# Patient Record
Sex: Male | Born: 1985 | Race: White | Hispanic: No | Marital: Single | Smoking: Never smoker
Health system: Southern US, Community
[De-identification: ages and names within clinical notes are randomized; demographics above are authoritative.]

## PROBLEM LIST (undated history)

## (undated) DIAGNOSIS — F410 Panic disorder [episodic paroxysmal anxiety] without agoraphobia: Secondary | ICD-10-CM

## (undated) DIAGNOSIS — F419 Anxiety disorder, unspecified: Secondary | ICD-10-CM

## (undated) DIAGNOSIS — Z87898 Personal history of other specified conditions: Secondary | ICD-10-CM

## (undated) DIAGNOSIS — F988 Other specified behavioral and emotional disorders with onset usually occurring in childhood and adolescence: Secondary | ICD-10-CM

---

## 2007-12-14 HISTORY — PX: CHEST TUBE INSERTION: SHX231

## 2009-01-18 ENCOUNTER — Emergency Department (HOSPITAL_COMMUNITY): Admission: EM | Admit: 2009-01-18 | Discharge: 2009-01-19 | Payer: Self-pay | Admitting: Emergency Medicine

## 2009-01-20 ENCOUNTER — Inpatient Hospital Stay (HOSPITAL_COMMUNITY): Admission: EM | Admit: 2009-01-20 | Discharge: 2009-02-03 | Payer: Self-pay | Admitting: Emergency Medicine

## 2009-01-26 ENCOUNTER — Ambulatory Visit: Payer: Self-pay | Admitting: Thoracic Surgery

## 2009-01-27 ENCOUNTER — Encounter: Payer: Self-pay | Admitting: Thoracic Surgery

## 2009-01-30 ENCOUNTER — Ambulatory Visit: Payer: Self-pay | Admitting: Infectious Diseases

## 2009-01-30 ENCOUNTER — Encounter: Payer: Self-pay | Admitting: Thoracic Surgery

## 2009-02-12 ENCOUNTER — Ambulatory Visit: Payer: Self-pay | Admitting: Thoracic Surgery

## 2009-02-12 ENCOUNTER — Encounter: Admission: RE | Admit: 2009-02-12 | Discharge: 2009-02-12 | Payer: Self-pay | Admitting: Thoracic Surgery

## 2009-03-17 ENCOUNTER — Encounter: Admission: RE | Admit: 2009-03-17 | Discharge: 2009-03-17 | Payer: Self-pay | Admitting: Thoracic Surgery

## 2009-03-17 ENCOUNTER — Ambulatory Visit: Payer: Self-pay | Admitting: Thoracic Surgery

## 2010-08-30 IMAGING — CR DG CHEST 2V
3 series · 3 of 3 positions shown · non-contrast
Comparison: None available

CLINICAL DATA: Wheezing.  Chest pain.  Short of breath.  Nausea.
Vomiting.  Weakness.

CHEST - 2 VIEW

[w chest lat (1 of 2)]
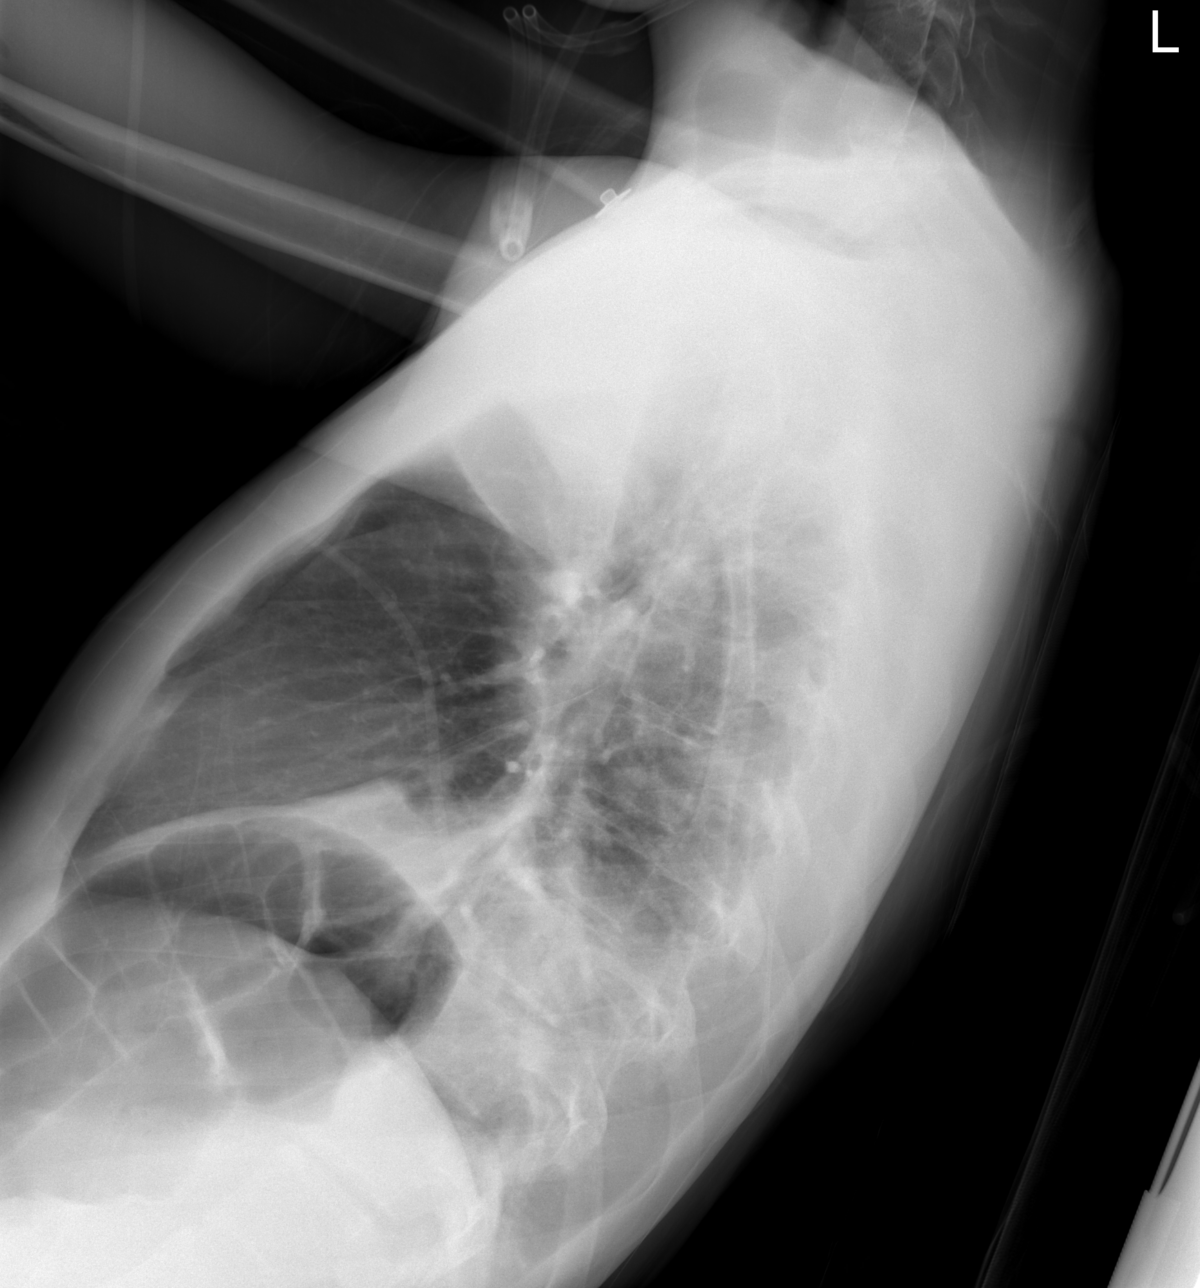

[w chest lat (2 of 2)]
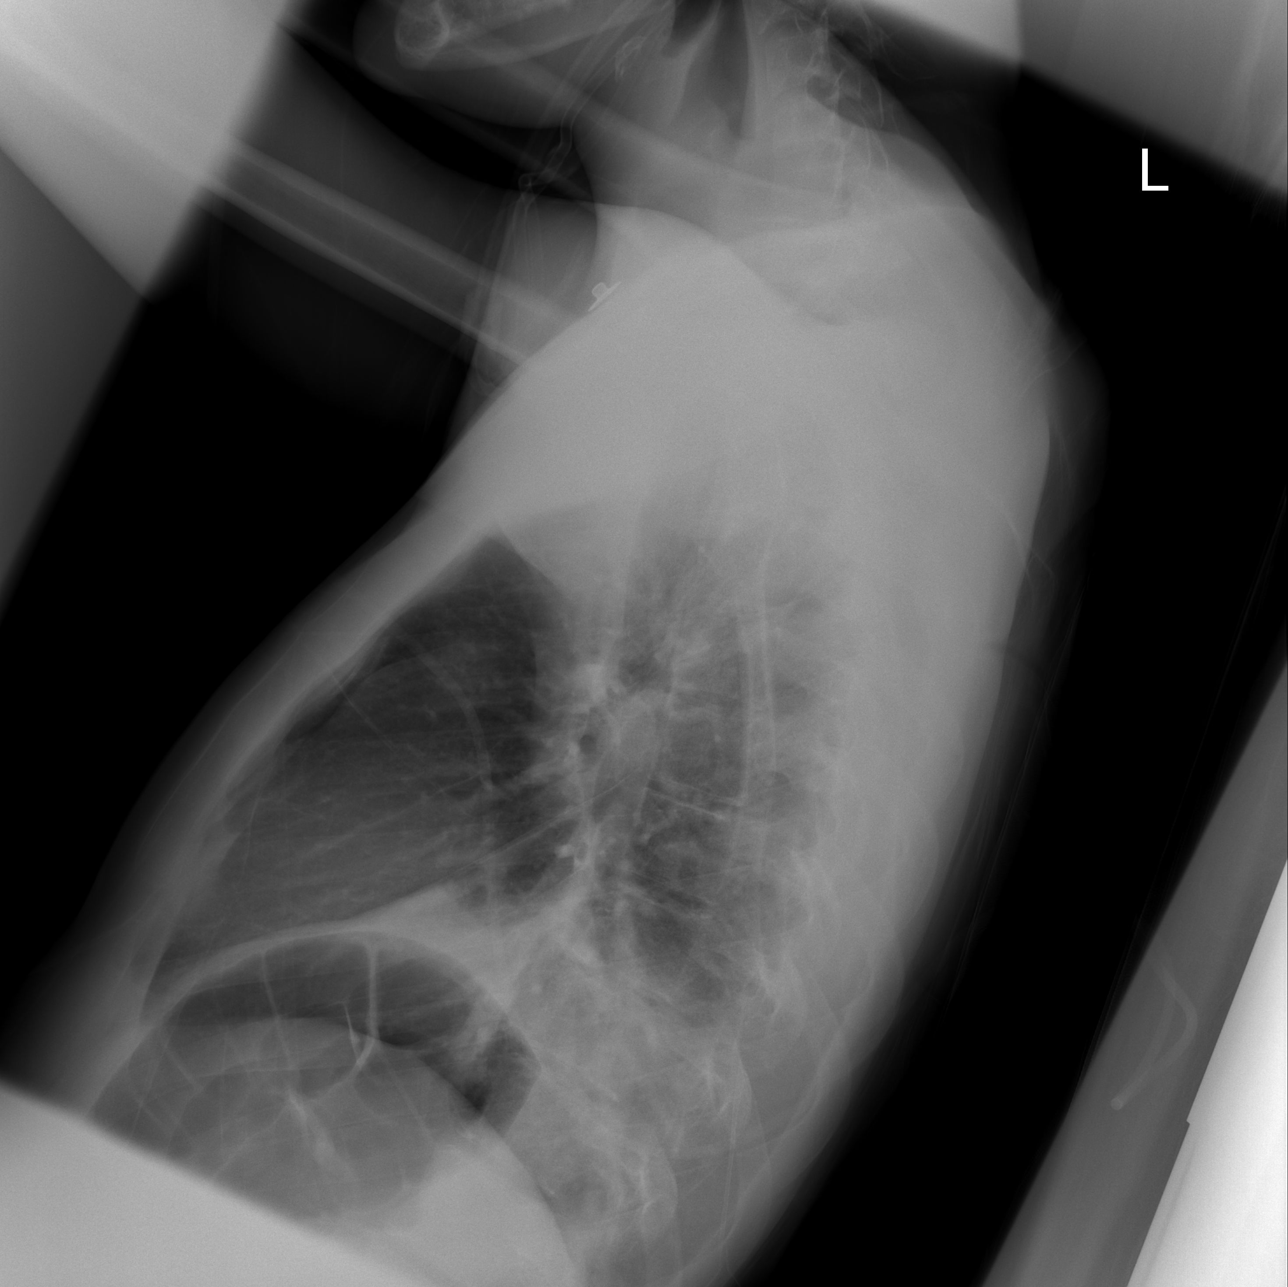

[view not recorded]
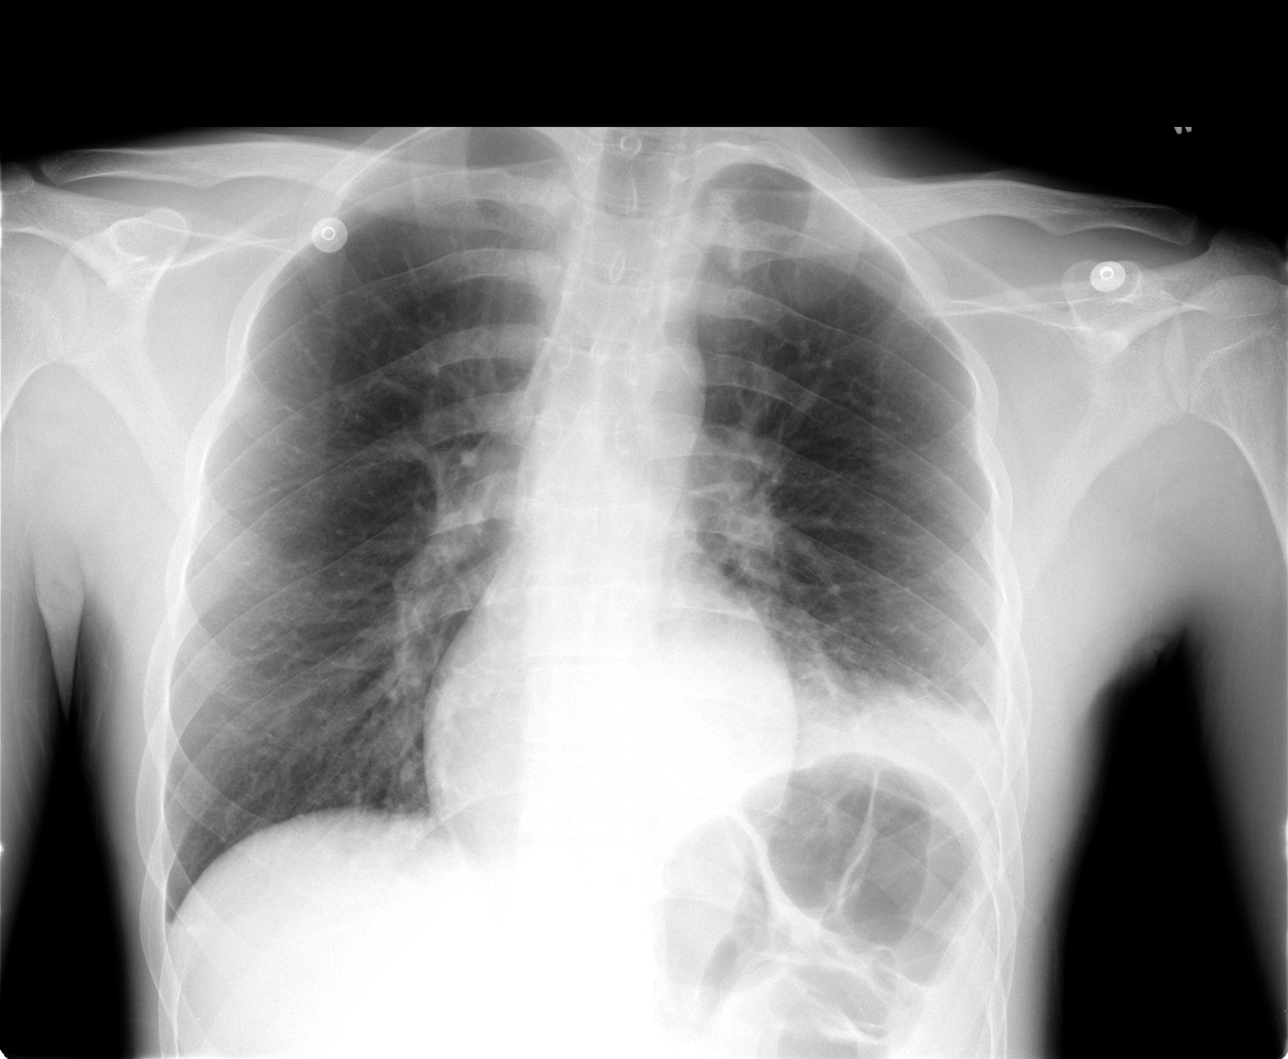

[3 of 3 positions shown; findings below may reference images not displayed]

FINDINGS: Volume loss and airspace disease are present within the
left lower lobe.  There is elevation of the left hemidiaphragm
associated with the volume loss.  The cardiopericardial silhouette
appears within normal limits.  The right lung is clear.  There may
be left pleural effusion. Follow-up to ensure radiographic clearing
recommended.  Clearing is usually observed at 4-6 weeks.
IMPRESSION: 1.  Left lower lobe volume loss and airspace disease.  Findings
most consistent with left lower lobe pneumonia and associated
atelectasis.

## 2010-08-31 IMAGING — CR DG CHEST 1V PORT
1 series · 1 of 1 positions shown · non-contrast
Comparison: 01/20/2009

CLINICAL DATA: Respiratory distress.

PORTABLE CHEST - 1 VIEW

[view not recorded]
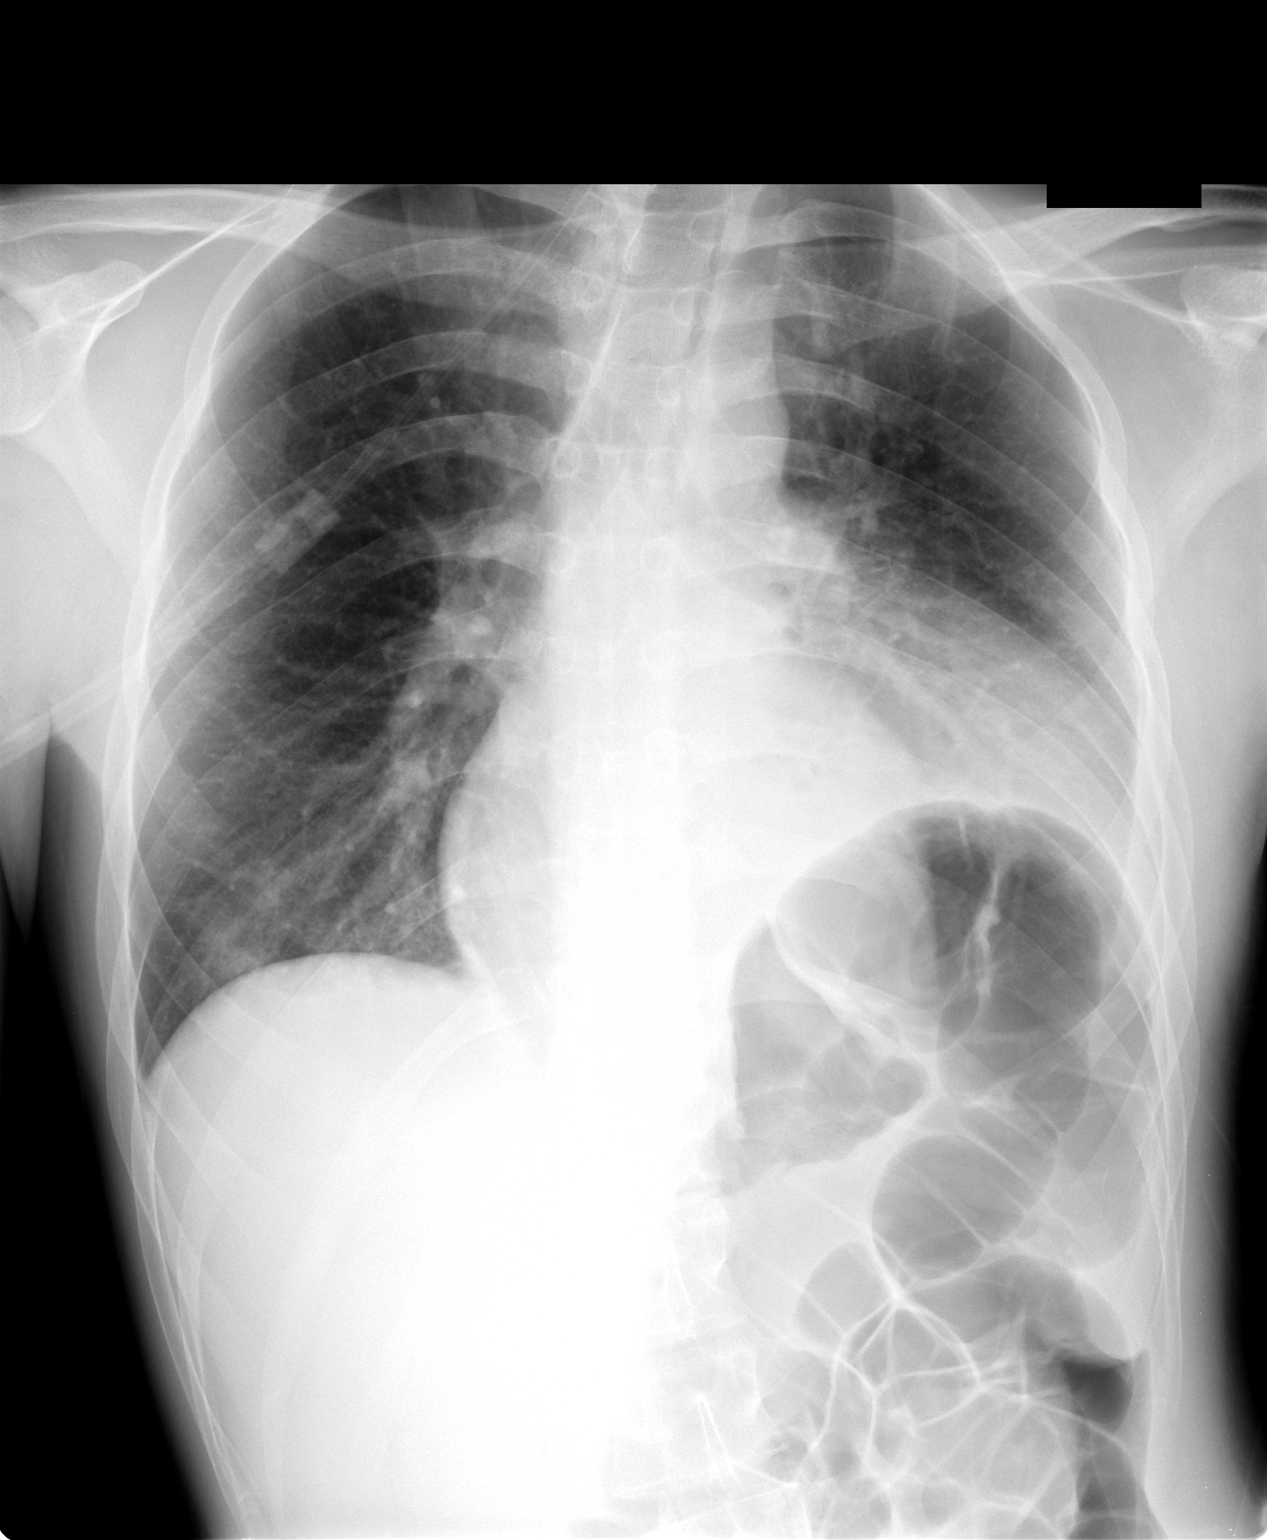

[1 of 1 positions shown; findings below may reference images not displayed]

FINDINGS: The cardiac silhouette, mediastinal and hilar contours
are stable.  There is persistent elevation of left hemidiaphragm
with slight progression of left lower lobe airspace process and
effusion.  The right lung is relatively clear except for patchy
basilar atelectasis or developing infiltrate.
IMPRESSION: 1.  Progressive left lower lobe process, likely pneumonia and
parapneumonic effusion.
2.  New patchy opacity at the right lung base may reflect
atelectasis or developing infiltrate.

## 2010-10-22 ENCOUNTER — Emergency Department (HOSPITAL_COMMUNITY): Admission: EM | Admit: 2010-10-22 | Discharge: 2010-10-22 | Payer: Self-pay | Admitting: Emergency Medicine

## 2010-12-10 ENCOUNTER — Emergency Department (HOSPITAL_COMMUNITY)
Admission: EM | Admit: 2010-12-10 | Discharge: 2010-12-10 | Payer: Self-pay | Source: Home / Self Care | Admitting: Emergency Medicine

## 2010-12-17 ENCOUNTER — Emergency Department (HOSPITAL_COMMUNITY)
Admission: EM | Admit: 2010-12-17 | Discharge: 2010-12-17 | Payer: Self-pay | Source: Home / Self Care | Admitting: Emergency Medicine

## 2010-12-17 LAB — URINALYSIS, ROUTINE W REFLEX MICROSCOPIC
Bilirubin Urine: NEGATIVE
Hemoglobin, Urine: NEGATIVE
Ketones, ur: NEGATIVE mg/dL
Nitrite: NEGATIVE
Protein, ur: NEGATIVE mg/dL
Specific Gravity, Urine: 1.01 (ref 1.005–1.030)
Urine Glucose, Fasting: NEGATIVE mg/dL
Urobilinogen, UA: 0.2 mg/dL (ref 0.0–1.0)
pH: 7 (ref 5.0–8.0)

## 2010-12-17 LAB — RAPID URINE DRUG SCREEN, HOSP PERFORMED
Amphetamines: NOT DETECTED
Barbiturates: NOT DETECTED
Benzodiazepines: NOT DETECTED
Cocaine: NOT DETECTED
Opiates: NOT DETECTED
Tetrahydrocannabinol: NOT DETECTED

## 2011-01-03 ENCOUNTER — Encounter: Payer: Self-pay | Admitting: Thoracic Surgery

## 2011-01-04 ENCOUNTER — Encounter: Payer: Self-pay | Admitting: Thoracic Surgery

## 2011-02-22 LAB — POCT I-STAT, CHEM 8
BUN: 15 mg/dL (ref 6–23)
Calcium, Ion: 1.11 mmol/L — ABNORMAL LOW (ref 1.12–1.32)
Chloride: 105 mEq/L (ref 96–112)
Creatinine, Ser: 1.2 mg/dL (ref 0.4–1.5)
Glucose, Bld: 83 mg/dL (ref 70–99)
HCT: 44 % (ref 39.0–52.0)
Hemoglobin: 15 g/dL (ref 13.0–17.0)
Potassium: 3.6 mEq/L (ref 3.5–5.1)
Sodium: 141 mEq/L (ref 135–145)
TCO2: 27 mmol/L (ref 0–100)

## 2011-02-22 LAB — RAPID URINE DRUG SCREEN, HOSP PERFORMED
Amphetamines: NOT DETECTED
Barbiturates: NOT DETECTED
Benzodiazepines: POSITIVE — AB
Cocaine: NOT DETECTED
Opiates: NOT DETECTED
Tetrahydrocannabinol: NOT DETECTED

## 2011-03-25 ENCOUNTER — Emergency Department (HOSPITAL_COMMUNITY)
Admission: EM | Admit: 2011-03-25 | Discharge: 2011-03-25 | Disposition: A | Discharge status: Home / Self Care | Payer: Self-pay | Attending: Emergency Medicine | Admitting: Emergency Medicine

## 2011-03-25 DIAGNOSIS — R946 Abnormal results of thyroid function studies: Secondary | ICD-10-CM | POA: Insufficient documentation

## 2011-03-26 ENCOUNTER — Ambulatory Visit (HOSPITAL_COMMUNITY): Payer: Self-pay

## 2011-03-26 ENCOUNTER — Other Ambulatory Visit (HOSPITAL_COMMUNITY): Payer: Self-pay

## 2011-03-30 LAB — COMPREHENSIVE METABOLIC PANEL
ALT: 13 U/L (ref 0–53)
AST: 14 U/L (ref 0–37)
Albumin: 3.2 g/dL — ABNORMAL LOW (ref 3.5–5.2)
Albumin: 3.8 g/dL (ref 3.5–5.2)
Alkaline Phosphatase: 63 U/L (ref 39–117)
Alkaline Phosphatase: 74 U/L (ref 39–117)
Alkaline Phosphatase: 111 U/L (ref 39–117)
BUN: 10 mg/dL (ref 6–23)
BUN: 12 mg/dL (ref 6–23)
BUN: 11 mg/dL (ref 6–23)
Chloride: 109 mEq/L (ref 96–112)
Chloride: 96 mEq/L (ref 96–112)
Creatinine, Ser: 1.04 mg/dL (ref 0.4–1.5)
GFR calc Af Amer: >60 mL/min (ref >60)
GFR calc Af Amer: >60 mL/min (ref >60)
GFR calc non Af Amer: >60 mL/min (ref >60)
Glucose, Bld: 99 mg/dL (ref 70–99)
Potassium: 3.6 mEq/L (ref 3.5–5.1)
Potassium: 3.9 mEq/L (ref 3.5–5.1)
Potassium: 4 mEq/L (ref 3.5–5.1)
Sodium: 136 mEq/L (ref 135–145)
Total Bilirubin: 0.6 mg/dL (ref 0.3–1.2)
Total Bilirubin: 1 mg/dL (ref 0.3–1.2)
Total Protein: 7.1 g/dL (ref 6.0–8.3)

## 2011-03-30 LAB — BASIC METABOLIC PANEL
BUN: 6 mg/dL (ref 6–23)
BUN: 8 mg/dL (ref 6–23)
BUN: 6 mg/dL (ref 6–23)
BUN: 8 mg/dL (ref 6–23)
BUN: 8 mg/dL (ref 6–23)
CO2: 26 mEq/L (ref 19–32)
CO2: 26 mEq/L (ref 19–32)
CO2: 27 mEq/L (ref 19–32)
CO2: 28 mEq/L (ref 19–32)
Calcium: 8.7 mg/dL (ref 8.4–10.5)
Calcium: 8.7 mg/dL (ref 8.4–10.5)
Calcium: 8.8 mg/dL (ref 8.4–10.5)
Calcium: 8.7 mg/dL (ref 8.4–10.5)
Chloride: 97 mEq/L (ref 96–112)
Chloride: 98 mEq/L (ref 96–112)
Creatinine, Ser: 0.89 mg/dL (ref 0.4–1.5)
Creatinine, Ser: 0.97 mg/dL (ref 0.4–1.5)
Creatinine, Ser: 1.26 mg/dL (ref 0.4–1.5)
GFR calc Af Amer: >60 mL/min (ref >60)
GFR calc Af Amer: >60 mL/min (ref >60)
GFR calc Af Amer: >60 mL/min (ref >60)
GFR calc Af Amer: >60 mL/min (ref >60)
GFR calc non Af Amer: >60 mL/min (ref >60)
GFR calc non Af Amer: >60 mL/min (ref >60)
GFR calc non Af Amer: >60 mL/min (ref >60)
GFR calc non Af Amer: >60 mL/min (ref >60)
Glucose, Bld: 98 mg/dL (ref 70–99)
Glucose, Bld: 98 mg/dL (ref 70–99)
Glucose, Bld: 99 mg/dL (ref 70–99)
Potassium: 3.9 mEq/L (ref 3.5–5.1)
Potassium: 4 mEq/L (ref 3.5–5.1)
Potassium: 4.2 mEq/L (ref 3.5–5.1)
Sodium: 133 mEq/L — ABNORMAL LOW (ref 135–145)
Sodium: 135 mEq/L (ref 135–145)
Sodium: 127 mEq/L — ABNORMAL LOW (ref 135–145)
Sodium: 136 mEq/L (ref 135–145)

## 2011-03-30 LAB — CBC
HCT: 37.3 % — ABNORMAL LOW (ref 39.0–52.0)
HCT: 39.1 % (ref 39.0–52.0)
HCT: 40.4 % (ref 39.0–52.0)
HCT: 44.3 % (ref 39.0–52.0)
HCT: 29.2 % — ABNORMAL LOW (ref 39.0–52.0)
HCT: 29.4 % — ABNORMAL LOW (ref 39.0–52.0)
HCT: 31.2 % — ABNORMAL LOW (ref 39.0–52.0)
HCT: 35.2 % — ABNORMAL LOW (ref 39.0–52.0)
Hemoglobin: 13.3 g/dL (ref 13.0–17.0)
Hemoglobin: 13.5 g/dL (ref 13.0–17.0)
Hemoglobin: 10.2 g/dL — ABNORMAL LOW (ref 13.0–17.0)
Hemoglobin: 10.2 g/dL — ABNORMAL LOW (ref 13.0–17.0)
Hemoglobin: 10.7 g/dL — ABNORMAL LOW (ref 13.0–17.0)
Hemoglobin: 12.4 g/dL — ABNORMAL LOW (ref 13.0–17.0)
MCHC: 34.1 g/dL (ref 30.0–36.0)
MCHC: 34.4 g/dL (ref 30.0–36.0)
MCHC: 34.5 g/dL (ref 30.0–36.0)
MCHC: 34.6 g/dL (ref 30.0–36.0)
MCV: 88.9 fL (ref 78.0–100.0)
MCV: 90.3 fL (ref 78.0–100.0)
MCV: 90.7 fL (ref 78.0–100.0)
Platelets: 246 10*3/uL (ref 150–400)
Platelets: 263 10*3/uL (ref 150–400)
Platelets: 308 10*3/uL (ref 150–400)
Platelets: 608 10*3/uL — ABNORMAL HIGH (ref 150–400)
Platelets: 615 10*3/uL — ABNORMAL HIGH (ref 150–400)
Platelets: 647 10*3/uL — ABNORMAL HIGH (ref 150–400)
Platelets: 919 10*3/uL (ref 150–400)
RBC: 4.31 MIL/uL (ref 4.22–5.81)
RBC: 4.36 MIL/uL (ref 4.22–5.81)
RBC: 4.4 MIL/uL (ref 4.22–5.81)
RBC: 3.24 MIL/uL — ABNORMAL LOW (ref 4.22–5.81)
RBC: 3.96 MIL/uL — ABNORMAL LOW (ref 4.22–5.81)
RDW: 11.7 % (ref 11.5–15.5)
RDW: 12.6 % (ref 11.5–15.5)
RDW: 12.8 % (ref 11.5–15.5)
RDW: 12.9 % (ref 11.5–15.5)
RDW: 11.9 % (ref 11.5–15.5)
RDW: 12.4 % (ref 11.5–15.5)
WBC: 13.7 10*3/uL — ABNORMAL HIGH (ref 4.0–10.5)
WBC: 20 10*3/uL — ABNORMAL HIGH (ref 4.0–10.5)
WBC: 11.1 10*3/uL — ABNORMAL HIGH (ref 4.0–10.5)
WBC: 13.5 10*3/uL — ABNORMAL HIGH (ref 4.0–10.5)
WBC: 13.8 10*3/uL — ABNORMAL HIGH (ref 4.0–10.5)
WBC: 24.6 10*3/uL — ABNORMAL HIGH (ref 4.0–10.5)

## 2011-03-30 LAB — DIFFERENTIAL
Basophils Absolute: 0 10*3/uL (ref 0.0–0.1)
Basophils Relative: 0 % (ref 0–1)
Blasts: 0 %
Eosinophils Relative: 0 % (ref 0–5)
Lymphocytes Relative: 3 % — ABNORMAL LOW (ref 12–46)
Lymphs Abs: 0.4 10*3/uL — ABNORMAL LOW (ref 0.7–4.0)
Metamyelocytes Relative: 0 %
Monocytes Absolute: 0.9 10*3/uL (ref 0.1–1.0)
Monocytes Relative: 4 % (ref 3–12)
Monocytes Relative: 6 % (ref 3–12)
Myelocytes: 0 %
Neutro Abs: 16.4 10*3/uL — ABNORMAL HIGH (ref 1.7–7.7)
Promyelocytes Absolute: 0 %

## 2011-03-30 LAB — CROSSMATCH
ABO/RH(D): O POS
Antibody Screen: NEGATIVE

## 2011-03-30 LAB — FUNGUS CULTURE W SMEAR
Fungal Smear: NONE SEEN
Fungal Smear: NONE SEEN

## 2011-03-30 LAB — POCT I-STAT 3, ART BLOOD GAS (G3+)
Acid-Base Excess: 1 mmol/L (ref 0.0–2.0)
Bicarbonate: 25.2 mEq/L — ABNORMAL HIGH (ref 20.0–24.0)
pO2, Arterial: 94 mmHg (ref 80.0–100.0)

## 2011-03-30 LAB — BLOOD GAS, ARTERIAL
Acid-base deficit: 0.7 mmol/L (ref 0.0–2.0)
TCO2: 20.1 mmol/L (ref 0–100)
pCO2 arterial: 36.5 mmHg (ref 35.0–45.0)

## 2011-03-30 LAB — LIPASE, BLOOD: Lipase: 12 U/L (ref 11–59)

## 2011-03-30 LAB — CULTURE, RESPIRATORY W GRAM STAIN

## 2011-03-30 LAB — BODY FLUID CULTURE: Culture: NO GROWTH

## 2011-03-30 LAB — PATHOLOGIST SMEAR REVIEW

## 2011-03-30 LAB — CULTURE, ROUTINE-ABSCESS

## 2011-03-30 LAB — URINALYSIS, ROUTINE W REFLEX MICROSCOPIC
Ketones, ur: 40 mg/dL — AB
Nitrite: NEGATIVE
Urobilinogen, UA: 1 mg/dL (ref 0.0–1.0)

## 2011-03-30 LAB — LEGIONELLA ANTIGEN, URINE: Legionella Antigen, Urine: NEGATIVE

## 2011-03-30 LAB — AFB CULTURE WITH SMEAR (NOT AT ARMC): Acid Fast Smear: NONE SEEN

## 2011-03-30 LAB — RAPID STREP SCREEN (MED CTR MEBANE ONLY): Streptococcus, Group A Screen (Direct): NEGATIVE

## 2011-03-30 LAB — D-DIMER, QUANTITATIVE: D-Dimer, Quant: 1.04 ug/mL-FEU — ABNORMAL HIGH (ref 0.00–0.48)

## 2011-03-30 LAB — VANCOMYCIN, TROUGH: Vancomycin Tr: 37.1 ug/mL (ref 10.0–20.0)

## 2011-03-30 LAB — VANCOMYCIN, RANDOM
Vancomycin Rm: 57.2 ug/mL
Vancomycin Rm: <5 ug/mL

## 2011-03-30 LAB — CULTURE, BLOOD (ROUTINE X 2)

## 2011-03-30 LAB — EXPECTORATED SPUTUM ASSESSMENT W GRAM STAIN, RFLX TO RESP C

## 2011-03-30 LAB — TISSUE CULTURE: Gram Stain: NONE SEEN

## 2011-04-27 NOTE — Discharge Summary (Signed)
NAMEMACOY, RODWELL              ACCOUNT NO.:  [id number]   MEDICAL RECORD NO.:  [id number]          PATIENT TYPE:  INP   LOCATION:  1510                         FACILITY:  [facility]   PHYSICIAN:  Hillery Aldo, M.D.   DATE OF BIRTH:  [birth year]   DATE OF ADMISSION:  01/20/2009  DATE OF DISCHARGE:  01/27/2009                               DISCHARGE SUMMARY   TRANSFER OF CARE NOTE - Date of transfer January 27, 2009.   PRIMARY CARE PHYSICIAN:  Unassigned.   DIAGNOSES AT THE TIME OF DISCHARGE:  1. Complex left lower lobe pneumonia.  2. Empyema.  3. Hypokalemia.  4. Mild hyponatremia.  5. Thrombocytosis.   MEDICATIONS:  Upon transfer included vancomycin and Zosyn.   CONSULTATIONS:  1. Dr. Edwyna Shell of CVTS.  2. Dr. Lowella Dandy of Interventional Radiology.   BRIEF ADMISSION HISTORY AND PRESENT ILLNESS:  The patient is a 25-year-  old male who presented to the hospital with vomiting and fever as well  as progressive dyspnea.  Upon initial evaluation in the emergency  department, he was found to have a significant left lower lobe pneumonia  and subsequently was admitted for further evaluation and treatment.  For  full details, please see the dictated report done by Dr. Ninfa Linden.   PROCEDURES AND DIAGNOSTIC STUDIES:  1. Chest X-Ray: On January 20, 2009 showed left lower lobe volume loss      and air space disease consistent with left lower lobe pneumonia and      associated atelectasis.  2. Chest X-Ray: On January 21, 2009 showed progressive left lower lobe      process, likely pneumonia and parapneumonic effusion.  New patchy      opacity at the right lung base and possibly reflective of      atelectasis or developing infiltrate.  3. CT angiogram of the chest:  On January 21, 2009 showed no evidence      for pulmonary emboli.  Normal thoracic aorta.  Dense left lower      lobe air space consolidation consistent with pneumonia.      Parapneumonic effusion.  Developing right lower lobe  infiltrate.      Elevation of the left hemidiaphragm.  4. Chest X-Ray: On January 22, 2009 showed new small infiltrate and      effusion of the right base.  Progressive left parapneumonic      effusion.  Persistent consolidation of the entire left lower lobe.  5. Chest X-Ray: On January 24, 2009 showed increasing pleural fluid      on the left with a low lobular contour or possibly indicating      loculation.  Possible empyema.  Persistent left lower lobe      pneumonia.  Small effusion on the right with mild right base      atelectasis.  6. Ultrasound of the chest:  On January 25, 2009 showed a complex      left pleural collection suggestive for empyema.  7. CT scan of the chest:  On January 25, 2009 showed significant      increase in left effusion, now large.  Compressive atelectasis      throughout much of the left lower lobe.  There was also an area of      mottled gas in the left lower lobe, possibly an area of necrosis      related to pneumonia.  Patchy right lower lobe opacity persists,      similar to prior study.  Small right effusion, slightly increased      since prior study.   LABORATORY DATA:  Upon transfer:  Blood cultures were negative times 5  days.  Sodium was 132, potassium 3.9, chloride 97, bicarb 26, BUN 8,  creatinine 0.89, glucose 98.  White blood cell count was 14.8,  hemoglobin 13.4, hematocrit 38.9, platelets 581.   HOSPITAL COURSE:  Left lower lobe pneumonia with empyema:  The patient  was initially admitted and treated with Avelox and conservative therapy.  Serial radiographs did not demonstrate any evidence of clearing and over  the course of his hospital stay, in fact, became more concerning for the  development of empyema.  A thoracentesis was requested, but could not be  performed secondary to the complex nature of the effusion.  Subsequently, Dr. Edwyna Shell of CVTS was consulted and he recommended  transfer to [facility] for a left VATS  procedure with drainage  of the empyema and possible drainage or resection of the left lower lobe  abscess.  The patient subsequently was transferred to the care of Dr.  Edwyna Shell on January 27, 2009 at [facility].      Hillery Aldo, M.D.  Electronically Signed     CR/MEDQ  D:  01/27/2009  T:  01/27/2009  Job:  [id number]   cc:   Dr. Edwyna Shell

## 2011-04-27 NOTE — Discharge Summary (Signed)
NAMELE, FAULCON              ACCOUNT NO.:  [id number]   MEDICAL RECORD NO.:  [id number]          PATIENT TYPE:  INP   LOCATION:  2003                         FACILITY:  [facility]   PHYSICIAN:  Ines Bloomer, M.D. DATE OF BIRTH:  08-13-1986   DATE OF ADMISSION:  01/20/2009  DATE OF DISCHARGE:  02/03/2009                               DISCHARGE SUMMARY   PRIMARY ADMITTING DIAGNOSES:  1. Vomiting.  2. Shortness of breath.  3. Fever.   ADDITIONAL/DISCHARGE DIAGNOSES:  1. Methicillin-resistant Staphylococcus aureus empyema.  2. Left lower lobe pneumonia.   PROCEDURES PERFORMED:  Left video-assisted thoracoscopic surgery, left  mini thoracotomy with drainage of empyema, and decortication.   HISTORY:  The patient is a 25 year old male who initially presented to  the emergency department approximately 2 days prior to this admission  complaining of a cough.  He was diagnosed with a viral syndrome and was  discharged home on Mucinex.  His symptoms worsened over the ensuing 24  hours and he presented back to the emergency department on January 20, 2009 complaining of nausea, vomiting, fever, and shortness of breath.  A  chest x-ray was performed which showed a left lower lobe infiltrate with  elevated white blood cell count of 17,000.  It was felt that he suffered  from a left lower lobe pneumonia and he was seen by the [facility] and was subsequently admitted for further evaluation and  treatment.   HOSPITAL COURSE:  The patient was initially admitted to [facility] and started on IV antibiotics for a left lower lobe pneumonia.  He continued to have chest discomfort with cough and leukocytosis.  A CT  scan was performed which showed left lower lobe pneumonia with  parapneumonic effusion.  When he symptomatically did not improve, a  repeat CT scan was performed on January 25, 2009 which showed an  abscess versus necrosis of a loculated empyema of the left lower  lobe.  At that time, Dr. Edwyna Shell was consulted for thoracic surgery evaluation.  He felt that the patient should undergo a left VATS with drainage of the  empyema and resection of the abscess.  He explained all risks, benefits,  and alternatives of surgery to the patient and he agreed to proceed.  He  was transferred from [facility] to [facility] on January 27, 2009 and  was taken to the operating room where he underwent a left VATS  decortication by Dr. Edwyna Shell.  Please see operative note for complete  details.  He was transferred to the SICU postoperatively in stable  condition.  He was doing well on postop day #1, was afebrile, and was  able to be transferred to the Step-Down Unit.  He was continued on  vancomycin and Zosyn for broad-spectrum IV antibiotic coverage  postoperatively.  Intraoperative fluid cultures were positive for few  MRSA.  All other cultures which were performed during his  hospitalization including blood cultures, Legionella strep, fungal, and  AFB were all negative.  An Infectious Disease consult was obtained and  the patient was seen by Dr. Maurice March.  It was recommended that the Zosyn be  discontinued and the patient be continued on vancomycin IV.  Initially,  it was felt that he would require 10-14 days of IV antibiotics and then  an additional 10-14 days of oral antibiotics.  His postoperative course  has been uneventful.  His chest tubes have been removed in the usual  fashion and his followup chest x-ray actually shows improvement of his  left lower lobe pneumonia.  His white blood cell count has trended  downward and is stable.  He has remained afebrile and his vital signs  have been stable postoperatively.  His incisions are all healing well.  His labs on February 02, 2009 showed hemoglobin 10.2, hematocrit 29.4,  white count 11.1, platelets 919, and creatinine 1.1.  He will be  reevaluated on morning of February 03, 2009 with a chest x-ray, PA and  lateral,  and labs.  Pending ID recommendations for outpatient antibiotic  therapy and morning round evaluation.  He will hopefully be ready for  discharge home on February 03, 2009.   DISCHARGE MEDICATIONS:  1. Tylox 1-2 q.4 h. p.r.n. pain.  2. Mucinex 600 mg b.i.d.  3. Vancomycin, home dose will be determined as well as duration by      Infectious Disease prior to discharge.   DISCHARGE INSTRUCTIONS:  He is asked to refrain from driving, heavy  lifting, or strenuous activity.  He may continue ambulating daily and  using his incentive spirometer.  He may shower daily and clean his  incisions with soap and water.  He will continue the same preoperative  diet.   DISCHARGE FOLLOWUP:  He will see Dr. Edwyna Shell back in the office in 1 week  with a chest x-ray.  We will also arrange outpatient ID followup as  directed.  He is asked to contact our office in the interim if he  experiences any problems or has questions.      Coral Ceo, P.A.      Ines Bloomer, M.D.  Electronically Signed    GC/MEDQ  D:  02/02/2009  T:  02/02/2009  Job:  [id number]   cc:   Fransisco Hertz, M.D.  [facility] Office

## 2011-04-27 NOTE — H&P (Signed)
[name], FRIEDEL              ACCOUNT NO.:  [id number]   MEDICAL RECORD NO.:  [id number]          PATIENT TYPE:  INP   LOCATION:  0114                         FACILITY:  [facility]   PHYSICIAN:  Oswald Hillock, MD        DATE OF BIRTH:  Feb 27, 1986   DATE OF ADMISSION:  01/20/2009  DATE OF DISCHARGE:                              HISTORY & PHYSICAL   CHIEF COMPLAINTS:  Vomiting, fever and difficulty breathing.   HISTORY OF PRESENT ILLNESS:  The patient is a 25 year old Caucasian male  who presents to the ER with 2-3 day history of nausea, vomiting, fever,  abdominal pain and respiratory distress.  Patient also has had cough  which was treated with Mucinex mucolytic with expectoration.  He was  seen 2 days back here in the ER and given a diagnosis of viral syndrome  and discharged home.  His symptoms have worsened over the last 24 hours.  He has had more vomiting, is having difficulty with deep breathing and  some associated chest discomfort as a result.   PAST MEDICAL HISTORY:  None.   PAST SURGICAL HISTORY:  None.   CURRENT MEDICATIONS:  Mucinex b.i.d.   ALLERGIES:  No known drug allergies.   FAMILY HISTORY:  No history of hypertension, coronary artery disease or  diabetes in the family.  No recent viral illnesses reported in any of  his close family members.   REVIEW OF SYSTEMS:  An extensive review of systems was done.  All  systems are negative except for positives mentioned in the history of  present illness.   PHYSICAL EXAMINATION:  VITALS:  On admission pulse is 94, blood pressure  113/68, respiratory rate of 24, temperature 97.4.  O2 saturations of 98%  on room air.  GENERAL:  The patient is conscious, alert, oriented to time, place and  person in mild to moderate distress, ill-looking, taking shallow  breaths.  HEENT:  No scleral icterus.  No conjunctival congestion.  No pallor.  Ears negative.  No significant oropharyngeal lesions.  NECK:  Is supple.  No  lymphadenopathy.  No JVD.  No carotid bruit.  CHEST:  Breath sounds heard bilaterally.  Air entry diminished breath  sounds with crackles at the left base.  CVS:  S1, S2 plus regular tachycardia.  No gallop, rub or murmur  appreciated.  ABDOMEN:  Is soft, deep tenderness in the epigastrium.  No guarding, no  rebound.  Bowel sounds present.  EXTREMITIES:  No cyanosis, clubbing or edema.   LABORATORY DATA:  WBC count is 17.6, hemoglobin 14.8, hematocrit 44.3,  platelet count of 246.  Sodium 136, potassium 2.6, chloride 100, CO2 is  25, glucose 138, BUN 12, creatinine 1.04, calcium of 9.3.  Urinalysis  showed positive ketones, otherwise negative for nitrite leuko esterase.  Chest x-ray showed left lower lobe infiltrate.   IMPRESSION AND PLAN:  This is a case of a 25 year old Caucasian male  with 3-day history of cough, fever, nausea, vomiting and generalized  aches and pains.  1. Left lower lobe pneumonia.  Patient was given a dose of Rocephin  and Zithromax in the emergency room.  We will discontinue that and      start him on Avelox 400 mg intravenous daily.  Check a urine for      Legionella.  Given that he has not had any diarrhea, we will use      Zofran 4 mg intravenous every 8 hours p.r.n. for nausea and      vomiting.  The patient will be started on a liquid diet and      advanced as tolerated.  We will use Xopenex 1.25 Neb every 6 and      every 2 hours p.r.n. for the next 24-48 hours.  The patient will be      monitored closely and we will obtain blood cultures x2 prior to      initiating Avelox.  2. Deep vein thrombosis/gastrointestinal prophylaxis.  No deep vein      thrombosis prophylaxis indicated.  Pepcid for gastrointestinal      prophylaxis.      Oswald Hillock, MD  Electronically Signed     BA/MEDQ  D:  01/20/2009  T:  01/20/2009  Job:  [id number]   cc:   Primary Care Physician

## 2011-04-27 NOTE — Consult Note (Signed)
NAMEWILFRIDO, [name]              ACCOUNT NO.:  [id number]   MEDICAL RECORD NO.:  [id number]          PATIENT TYPE:  INP   LOCATION:  1510                         FACILITY:  [facility]   PHYSICIAN:  Ines Bloomer, M.D. DATE OF BIRTH:  06/30/1986   DATE OF CONSULTATION:  DATE OF DISCHARGE:                                 CONSULTATION   PREOPERATIVE DIAGNOSIS:  Left lower lobe pneumonia with empyema,  possible necrosis, and possible abscess of left lower lobe.   HISTORY OF PRESENT ILLNESS:  This is a 25 year old Caucasian male that  presented to the emergency room several times with a very minor cough,  nausea, vomiting, and fever, and respiratory distress.  He was treated.  He was told he had a viral syndrome and went home and continued to  deteriorate and came back in with increasing left chest discomfort.  A  CT scan showed left lower lobe pneumonia with parapneumonic effusion.  He was admitted to the hospital and was started on vancomycin and Zosyn  after receiving apparently his Rocephin and Zithromax.  While in the  hospital, his white count was 20,000  and had temperatures of 98.  Had  increasing symptoms with a chest x-ray showing an increase in his  effusion.  Attempted thoracentesis could not be done and the patient had  a repeat CT scan at my request, which showed a progression in his left  lower lobe pneumonia with questionable abscess versus necrosis in the  medial basilar segment, as well as loculated empyema.   PAST MEDICAL HISTORY:  Noncontributory.  No previous surgery.   MEDICATIONS:  In the hospital include vancomycin, Ventolin, Zosyn,  Dilaudid for pain, MiraLax, Robitussin, Roxicodone, Zosyn, Lovenox,  Mucinex, Pepcid, and Colace.   ALLERGIES:  He has got no allergies.   FAMILY HISTORY:  Negative for cancer.   REVIEW OF SYSTEMS:  VITAL SIGNS:  Weight has been stable.  CARDIAC:  No  angina or atrial fibrillation.  PULMONARY:  No hemoptysis, see history  of  present illness.  GI:  See history of present illness.  GU:  No  dysuria or frequent urination.  VASCULAR:  No claudication, DVT, or  TIAs.  NEUROLOGICAL:  No dizziness, headaches, blackouts, or seizures.  MUSCULOSKELETAL:  Some aches and pains, but no arthritis.  HEMATOLOGICAL:  No problems with bleeding, clotting disorders, or  anemia.  HEENT:  No changes in eyesight or hearing.  PSYCHIATRIC:  No  psychiatric illnesses.   PHYSICAL EXAMINATION:  VITAL SIGNS:  His blood pressure was 110/70,  respirations were 20, temperature was 99.8, and sats were 98%.  HEAD, EYES, EARS, NOSE, AND THROAT:  Unremarkable.  NECK:  Supple.  LUNGS:  Decreased breath sounds in the left.  HEART:  Regular sinus rhythm.  No murmurs.  ABDOMEN:  Soft.  No hepatosplenomegaly.  EXTREMITIES:  Pulses 2+.  There is no clubbing or edema.  NEUROLOGICAL:  He is oriented x3.  Sensory and motor intact.  Cranial  nerves intact.   IMPRESSION:  Left lower lobe pneumonia with questionable abscess and  necrosis, left chest empyema.   PLAN:  Left VATS, drainage of empyema with possible drainage or  resection of left lower lobe abscess.      Ines Bloomer, M.D.  Electronically Signed     DPB/MEDQ  D:  01/26/2009  T:  01/26/2009  Job:  [id number]

## 2011-04-27 NOTE — Op Note (Signed)
NAMEGLADYS, [name]              ACCOUNT NO.:  [id number]   MEDICAL RECORD NO.:  [id number]          PATIENT TYPE:  INP   LOCATION:  2308                         FACILITY:  [facility]   PHYSICIAN:  Ines Bloomer, M.D. DATE OF BIRTH:  1986/06/03   DATE OF PROCEDURE:  01/27/2009  DATE OF DISCHARGE:                               OPERATIVE REPORT   PREOPERATIVE DIAGNOSIS:  Empyema of left chest.   POSTOPERATIVE DIAGNOSIS:  Empyema of left chest.   OPERATION PERFORMED:  Left VATS minithoracotomy, drainage of empyema  with decortication, and drainage of abscess.   SURGEON:  Ines Bloomer, MD   ANESTHESIA:  General anesthesia.   After percutaneous insertion of all monitoring lines, the patient  underwent general anesthesia.  He was turned in left lateral thoracotomy  positions.  He was prepped and draped in usual sterile manner.  A dual-  lumen tube inserted.  The left lung was deflated.  Two trocar sites were  made in the anterior and posterior axillary line at the eighth  intercostal space.  Two trocars were inserted.  A 30-degree scope was  inserted.  The patient had an obvious empyema.  For this reason,  cultures were taken of the area and pictures and we then debrided the  lung off the chest wall using Kaiser ring forceps to debride the exudate  off the chest wall and to free up the lung medially and laterally, but  there was a thickened exudate along the left lower lobe that we could  not remove without a small thoracotomy.  We made a small thoracotomy  over the sixth intercostal space partially dividing the latissimus and  splitting the serratus, putting the small Churchill retractor in place.  We then proceeded to do a decortication, first freeing up the fissure  which had marked amount of inflammatory tissue in the fissure and  decorticating and removing the inflammatory tissue with sharp and blunt  dissection using multiple types of forceps.  We then freed the left  upper  lobe off the mediastinum and then left lower lobe and lingula off  the pericardium.  Lower lobe was then freed up off the diaphragm and the  costophrenic angle.  We resected multiple inflammatory tissue and then  went up posteriorly along the gutter, taking all the inflammatory tissue  off the wall.  We then did a decortication of the lingula and the  posterior segment of the upper lobe and then did a complete  decortication of the left lower lobe and including the superior segment.  In the medial basilar segment, there was obvious abscess that had  partially perforated.  We opened this up and drained the abscess  and re-  cultured it and I then re-sutured it with 2-0 Vicryl.  The areas were  irrigated copiously.  Three chest tubes were placed, 2 through the  trocar site and a right-angle 28 with another stab wound between the  other 2 chest tubes.  Single On-Q was inserted in the fashion, an On-Q  block  was done in the usual fashion.  Chest was closed with 4  pericostal  drilling through the sixth and seventh rib and passed around the sixth  rib, #1 Vicryl in the muscle layer, 2-0 Vicryl subcutaneous tissue, and  Dermabond for the skin.  The patient returned to recovery room in stable  condition.       Ines Bloomer, M.D.  Electronically Signed     DPB/MEDQ  D:  01/27/2009  T:  01/28/2009  Job:  [id number]

## 2011-04-27 NOTE — Assessment & Plan Note (Signed)
OFFICE VISIT   [name], [name]  DOB:  Mar 22, 1986                                        March 17, 2009  CHART #:  [id number]   The patient returns today.  His chest x-ray shows normal postoperative  changes.  He had some mild postoperative pain from his left empyema and  he is doing well overall.  I have released him to full activity, and I  will see him back again in 2 months with a chest x-ray for a final  check.  Blood pressure is 122/75, pulse 96, respirations 18, sats were  98%.   Ines Bloomer, M.D.  Electronically Signed   DPB/MEDQ  D:  03/17/2009  T:  03/18/2009  Job:  [id number]

## 2011-04-27 NOTE — Assessment & Plan Note (Signed)
OFFICE VISIT   KJ, IMBERT  DOB:  Mar 09, 1986                                        February 12, 2009  CHART #:  [id number]   The patient came for followup after his empyema.  His chest x-ray still  shows reaction in his left lower lobe, but it is improving.  He removed  his chest tube sutures.  His blood pressure was 90/60, pulse 72,  respirations 18, and sats were 95%.  He had MRSA and was supposed to  take Tylox postoperatively but could not afford the medication.  There  is some question whether he used Bactrim or not.  I will refer that to  Infectious Disease and decide any further treatment is needed.  From my  standpoint, everything looks good.  I will see him back in 3 weeks with  a chest x-ray.   Ines Bloomer, M.D.  Electronically Signed   DPB/MEDQ  D:  02/12/2009  T:  02/12/2009  Job:  [id number]

## 2012-06-07 DIAGNOSIS — E042 Nontoxic multinodular goiter: Secondary | ICD-10-CM | POA: Insufficient documentation

## 2013-01-03 ENCOUNTER — Encounter: Payer: Self-pay | Admitting: *Deleted

## 2013-01-03 ENCOUNTER — Emergency Department (INDEPENDENT_AMBULATORY_CARE_PROVIDER_SITE_OTHER): Payer: 59

## 2013-01-03 ENCOUNTER — Emergency Department (INDEPENDENT_AMBULATORY_CARE_PROVIDER_SITE_OTHER)
Admission: EM | Admit: 2013-01-03 | Discharge: 2013-01-03 | Disposition: A | Discharge status: Home / Self Care | Payer: 59 | Source: Home / Self Care | Attending: Family Medicine | Admitting: Family Medicine

## 2013-01-03 DIAGNOSIS — B349 Viral infection, unspecified: Secondary | ICD-10-CM

## 2013-01-03 DIAGNOSIS — R05 Cough: Secondary | ICD-10-CM

## 2013-01-03 DIAGNOSIS — R112 Nausea with vomiting, unspecified: Secondary | ICD-10-CM

## 2013-01-03 DIAGNOSIS — R52 Pain, unspecified: Secondary | ICD-10-CM

## 2013-01-03 DIAGNOSIS — J40 Bronchitis, not specified as acute or chronic: Secondary | ICD-10-CM

## 2013-01-03 DIAGNOSIS — R6883 Chills (without fever): Secondary | ICD-10-CM

## 2013-01-03 HISTORY — DX: Personal history of other specified conditions: Z87.898

## 2013-01-03 HISTORY — DX: Panic disorder (episodic paroxysmal anxiety): F41.0

## 2013-01-03 HISTORY — DX: Other specified behavioral and emotional disorders with onset usually occurring in childhood and adolescence: F98.8

## 2013-01-03 HISTORY — DX: Anxiety disorder, unspecified: F41.9

## 2013-01-03 LAB — POCT INFLUENZA A/B: Influenza B, POC: NEGATIVE

## 2013-01-03 MED ORDER — AZITHROMYCIN 250 MG PO TABS: ORAL_TABLET | ORAL | Status: AC

## 2013-01-03 MED ORDER — ONDANSETRON 4 MG PO TBDP: 4.0000 mg | ORAL_TABLET | Freq: Three times a day (TID) | ORAL | Status: AC | PRN

## 2013-01-03 NOTE — ED Provider Notes (Signed)
History     CSN: [id number]  Arrival date & time 01/03/13  1504   First MD Initiated Contact with Patient 01/03/13 [phone]      Chief Complaint  Patient presents with  . Generalized Body Aches  . Numbness  . Emesis   HPI  URI Symptoms Onset: 2-3 days  Description: cough, nausea, generalized malaise, chills. Has not had flu shot.  Modifying factors:  Is on multiple neuro/psych medications. Has been unable to hold down medications 2/2 nausea. Has also had some mild R sided numbness and tingling. This is new.  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   Symptoms Nasal discharge: yes Fever: no Sore throat: no Cough: yes Wheezing: no Ear pain: no GI symptoms: nausea. Emesis (NBNB) x2-3 over last 2-3 days  Sick contacts: unsure   Red Flags  Stiff neck: no Dyspnea: no Rash: no Swallowing difficulty: no  Sinusitis Risk Factors Headache/face pain: no Double sickening: no tooth pain: no  Allergy Risk Factors Sneezing: no Itchy scratchy throat: no Seasonal symptoms: no  Flu Risk Factors Headache: mild muscle aches: mild severe fatigue: mild    Past Medical History  Diagnosis Date  . Anxiety   . Panic attacks   . ADD (attention deficit disorder)   . History of seizures     Past Surgical History  Procedure Date  . Chest tube insertion 2009    drainage from MRSA infection    History reviewed. No pertinent family history.  History  Substance Use Topics  . Smoking status: Never Smoker   . Smokeless tobacco: Never Used  . Alcohol Use: No      Review of Systems  All other systems reviewed and are negative.    Allergies  Review of patient's allergies indicates not on file.  Home Medications   Current Outpatient Rx  Name  Route  Sig  Dispense  Refill  . AMPHETAMINE-DEXTROAMPHETAMINE 10 MG PO TABS   Oral   Take 10 mg by mouth daily.         Marland Kitchen CLONIDINE HCL 0.2 MG PO TABS   Oral   Take 0.2 mg by mouth  2 (two) times daily.         Marland Kitchen OXCARBAZEPINE 600 MG PO TABS   Oral   Take 600 mg by mouth 2 (two) times daily.         . QUETIAPINE FUMARATE 400 MG PO TABS   Oral   Take 400 mg by mouth at bedtime.         . SERTRALINE HCL 50 MG PO TABS   Oral   Take 50 mg by mouth daily.           BP 124/86  Pulse 76  Temp 97.8 F (36.6 C) (Oral)  Resp 16  Wt 185 lb (83.915 kg)  SpO2 98%  Physical Exam  Constitutional: He appears well-developed and well-nourished.  HENT:  Head: Normocephalic and atraumatic.  Right Ear: External ear normal.  Left Ear: External ear normal.       +nasal erythema, rhinorrhea bilaterally, + post oropharyngeal erythema    Eyes: Conjunctivae normal are normal. Pupils are equal, round, and reactive to light.  Neck: Normal range of motion. Neck supple.  Cardiovascular: Normal rate and regular rhythm.   Pulmonary/Chest: Effort normal and breath sounds normal. He has no wheezes. He has no rales.  Abdominal: Soft.  Musculoskeletal: Normal range of motion.  Neurological: He  is alert. No cranial nerve deficit. He exhibits normal muscle tone. Coordination normal.       Strength symmetric throughout    Skin: Skin is warm.    ED Course  Procedures (including critical care time)   Labs Reviewed  POCT INFLUENZA A/B   Dg Chest 2 View  01/03/2013  *RADIOLOGY REPORT*  Clinical Data: Cough, body aches, nausea, vomiting  CHEST - 2 VIEW  Comparison: 03/17/2009  Findings: Normal heart size, mediastinal contours, and pulmonary vascularity. Lungs are well expanded with minimal peribronchial thickening. No acute infiltrate, pleural effusion or pneumothorax. No acute osseous findings. Minimal elevation left diaphragm unchanged.  IMPRESSION: Minimal bronchitic changes without infiltrate.   Original Report Authenticated By: Ulyses Southward, M.D.      1. Viral illness   2. Bronchitis       MDM  Suspect likely viral illness as nidus for majority of sxs. Viral gastro  vs. Flu.  Rapid flu negative.  Will place on zofran for symptomatic management.  Noted bronchitis on CXR. WIll place on zpak for coverage.  Physical exam is generally reassuring. Suspect that numbness and tingling are likely related to medications.  A same day appt was made for pt to see his neurologist and [facility]. Address, directions, and contact information given to pt.  Discussed general care follow up as needed.     The patient and/or caregiver has been counseled thoroughly with regard to treatment plan and/or medications prescribed including dosage, schedule, interactions, rationale for use, and possible side effects and they verbalize understanding. Diagnoses and expected course of recovery discussed and will return if not improved as expected or if the condition worsens. Patient and/or caregiver verbalized understanding.             Doree Albee, MD 01/03/13 [phone]

## 2013-01-03 NOTE — ED Notes (Addendum)
Patient c/o 2 days of body aches, Chills, HA, dry cough and numbness to right arm and face. 3 days of vomiting. Last night he developed  central CP with cough and deep breathing. Denies current CP or vision changes.  He has not had a flu vaccine this year.

## 2015-06-05 DIAGNOSIS — F840 Autistic disorder: Secondary | ICD-10-CM | POA: Insufficient documentation

## 2018-06-14 DIAGNOSIS — E785 Hyperlipidemia, unspecified: Secondary | ICD-10-CM | POA: Insufficient documentation

## 2020-02-12 DIAGNOSIS — F900 Attention-deficit hyperactivity disorder, predominantly inattentive type: Secondary | ICD-10-CM | POA: Insufficient documentation

## 2020-08-04 ENCOUNTER — Ambulatory Visit (HOSPITAL_COMMUNITY): Payer: Medicare Other | Admitting: Licensed Clinical Social Worker

## 2020-08-19 ENCOUNTER — Ambulatory Visit (HOSPITAL_COMMUNITY): Payer: Medicare Other | Admitting: Licensed Clinical Social Worker

## 2021-08-13 ENCOUNTER — Emergency Department (INDEPENDENT_AMBULATORY_CARE_PROVIDER_SITE_OTHER)
Admission: RE | Admit: 2021-08-13 | Discharge: 2021-08-13 | Disposition: A | Discharge status: Home / Self Care | Payer: Medicare HMO | Source: Ambulatory Visit

## 2021-08-13 ENCOUNTER — Emergency Department (INDEPENDENT_AMBULATORY_CARE_PROVIDER_SITE_OTHER): Payer: Medicare HMO

## 2021-08-13 ENCOUNTER — Other Ambulatory Visit: Payer: Self-pay

## 2021-08-13 VITALS — BP 107/75 | HR 60 | Temp 98.7°F | Resp 16 | Ht 70.0 in | Wt 189.0 lb

## 2021-08-13 DIAGNOSIS — R079 Chest pain, unspecified: Secondary | ICD-10-CM | POA: Diagnosis not present

## 2021-08-13 DIAGNOSIS — K219 Gastro-esophageal reflux disease without esophagitis: Secondary | ICD-10-CM

## 2021-08-13 MED ORDER — OMEPRAZOLE 20 MG PO CPDR: 20.0000 mg | DELAYED_RELEASE_CAPSULE | Freq: Every day | ORAL | 0 refills | Status: AC

## 2021-08-13 NOTE — ED Triage Notes (Signed)
Sharp intermittent pain to left  side of chest since last night  Ate pizza yesterday  Ibuprofen last night  Headaches on & off x 2 months - CT scan was clear Denies fever or chills COVID vaccine x 2  - no booster

## 2021-08-13 NOTE — ED Provider Notes (Signed)
[name]    CSN: [id number] Arrival date & time: 08/13/21  1254      History   Chief Complaint Chief Complaint  Patient presents with   Chest Pain    Left side    HPI [name] is a 35 y.o. male.   HPI 35 year old male presents with chest pain since last night, ports sharp intermittent to the left side of chest.  Patient reports eating pizza yesterday afternoon and took ibuprofen last night for chest pain.  Patient is accompanied by his Mother this afternoon.  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 Authorizing Provider  ibuprofen (ADVIL) 800 MG tablet Take by mouth. 06/17/21  Yes [provider]  methylphenidate (RITALIN) 5 MG tablet Take by mouth. 08/10/21  Yes [provider]  omeprazole (PRILOSEC) 20 MG capsule Take 1 capsule (20 mg total) by mouth daily. 08/13/21 09/12/21 Yes Trevor Iha, FNP  amphetamine-dextroamphetamine (ADDERALL) 10 MG tablet Take 10 mg by mouth daily. Patient not taking: Reported on 08/13/2021    [provider]  atorvastatin (LIPITOR) 20 MG tablet Take 20 mg by mouth daily. 06/27/21   [provider]  azithromycin (ZITHROMAX) 250 MG tablet Take 2 tabs PO x 1 dose, then 1 tab PO QD x 4 days Patient not taking: Reported on 08/13/2021 01/03/13   Floydene Flock, MD  cloNIDine (CATAPRES) 0.2 MG tablet Take 0.2 mg by mouth 2 (two) times daily. Patient not taking:  Reported on 08/13/2021    [provider]  levothyroxine (SYNTHROID) 75 MCG tablet Take 75 mcg by mouth daily. 07/04/21   [provider]  ondansetron (ZOFRAN ODT) 4 MG disintegrating tablet Take 1 tablet (4 mg total) by mouth every 8 (eight) hours as needed for nausea. Patient not taking: Reported on 08/13/2021 01/03/13   Floydene Flock, MD  oxcarbazepine (TRILEPTAL) 600 MG tablet Take 600 mg by mouth 2 (two) times daily. Patient not taking: Reported on 08/13/2021    [provider]  sertraline (ZOLOFT) 50 MG tablet Take 50 mg by mouth daily. Patient not taking: Reported on 08/13/2021    [provider]    Family History Family History  Problem Relation Age of Onset   Clotting disorder Mother     Social History Social History   Tobacco Use   Smoking status: Never   Smokeless tobacco: Never  Substance Use Topics   Alcohol use: No   Drug use: No     Allergies   Patient has no known allergies.   Review of Systems Review of Systems  Cardiovascular:  Positive for chest pain.  All other systems reviewed and are negative.   Physical Exam Triage Vital Signs ED Triage Vitals  Enc Vitals Group     BP 08/13/21 1303 107/75     Pulse Rate 08/13/21 1303 60     Resp 08/13/21 1303 16  Temp 08/13/21 1303 98.7 F (37.1 C)     Temp Source 08/13/21 1303 Oral     SpO2 08/13/21 1303 97 %     Weight 08/13/21 1308 189 lb (85.7 kg)     Height 08/13/21 1308 5\' 10"  (1.778 m)     Head Circumference --      Peak Flow --      Pain Score 08/13/21 1307 4     Pain Loc --      Pain Edu? --      Excl. in GC? --    No data found.  Updated Vital Signs BP 107/75 (BP Location: Left Arm)   Pulse 60   Temp 98.7 F (37.1 C) (Oral)   Resp 16   Ht 5\' 10"  (1.778 m)   Wt 189 lb (85.7 kg)   SpO2 97%   BMI 27.12 kg/m       Physical Exam Vitals and nursing note reviewed.  Constitutional:      General: He is not in acute distress.    Appearance: Normal  appearance. He is normal weight. He is not ill-appearing.  HENT:     Head: Normocephalic and atraumatic.     Mouth/Throat:     Mouth: Mucous membranes are moist.     Pharynx: Oropharynx is clear.  Eyes:     Extraocular Movements: Extraocular movements intact.     Conjunctiva/sclera: Conjunctivae normal.     Pupils: Pupils are equal, round, and reactive to light.  Cardiovascular:     Rate and Rhythm: Regular rhythm. Bradycardia present.     Pulses: Normal pulses.     Heart sounds: Normal heart sounds. No murmur heard.   No friction rub. No gallop.  Pulmonary:     Effort: Pulmonary effort is normal.     Breath sounds: No wheezing, rhonchi or rales.  Musculoskeletal:        General: Normal range of motion.     Cervical back: Normal range of motion and neck supple.  Skin:    General: Skin is warm and dry.  Neurological:     General: No focal deficit present.     Mental Status: He is alert and oriented to person, place, and time. Mental status is at baseline.  Psychiatric:        Mood and Affect: Mood normal.        Behavior: Behavior normal.        Thought Content: Thought content normal.     UC Treatments / Results  Labs (all labs ordered are listed, but only abnormal results are displayed) Labs Reviewed - No data to display  EKG   Radiology DG Chest 2 View  Result Date: 08/13/2021 CLINICAL DATA:  Chest pain EXAM: CHEST - 2 VIEW COMPARISON:  Chest radiograph 01/03/2013 FINDINGS: The cardiomediastinal silhouette is normal. The lungs are well inflated. There is asymmetric elevation of the left hemidiaphragm. There is no focal consolidation or pulmonary edema. There is no pleural effusion or pneumothorax. There is no acute osseous abnormality. There is mild S shaped scoliosis of the spine, unchanged IMPRESSION: No radiographic evidence of acute cardiopulmonary process. Electronically Signed   By: 10/13/2021 M.D.   On: 08/13/2021 14:03    Procedures Procedures (including  critical care time)  Medications Ordered in UC Medications - No data to display  Initial Impression / Assessment and Plan / UC Course  I have reviewed the triage vital signs and the nursing notes.  Pertinent labs & imaging results  that were available during my care of the patient were reviewed by me and considered in my medical decision making (see chart for details).     MDM: 1.  Chest pain unspecified type-EKG reveals sinus bradycardia, normal EKG, CXR reveals-no acute cardiopulmonary process; 2. GERD-Rx'd omeprazole. Advised patient/Mother please take medication on empty stomach with 8 ounces of water first thing in the morning and do not eat or take other medications for 45 minutes after taking Omeprazole. Patient discharged home, hemodynamically stable. Final Clinical Impressions(s) / UC Diagnoses   Final diagnoses:  Chest pain, unspecified type  Gastroesophageal reflux disease without esophagitis     Discharge Instructions      Advised patient/Mother please take medication on empty stomach with 8 ounces of water first thing in the morning and do not eat or take other medications for 45 minutes after taking Omeprazole.     ED Prescriptions     Medication Sig Dispense Auth. Provider   omeprazole (PRILOSEC) 20 MG capsule Take 1 capsule (20 mg total) by mouth daily. 30 capsule Trevor Iha, FNP      PDMP not reviewed this encounter.   Trevor Iha, FNP 08/13/21 1424

## 2021-08-13 NOTE — Discharge Instructions (Addendum)
Advised patient/Mother please take medication on empty stomach with 8 ounces of water first thing in the morning and do not eat or take other medications for 45 minutes after taking Omeprazole.

## 2021-08-13 NOTE — ED Notes (Signed)
Duplicate order placed for EKG - standing order - disregard 2nd order
